# Patient Record
Sex: Male | Born: 2005 | Race: White | Hispanic: No | Marital: Single | Smoking: Never smoker
Health system: Southern US, Community
[De-identification: ages and names within clinical notes are randomized; demographics above are authoritative.]

---
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
## 2025-01-02 ENCOUNTER — Other Ambulatory Visit: Payer: Self-pay

## 2025-01-02 ENCOUNTER — Emergency Department: Payer: Self-pay

## 2025-01-02 DIAGNOSIS — R0602 Shortness of breath: Secondary | ICD-10-CM | POA: Insufficient documentation

## 2025-01-02 DIAGNOSIS — R258 Other abnormal involuntary movements: Secondary | ICD-10-CM | POA: Insufficient documentation

## 2025-01-02 DIAGNOSIS — R531 Weakness: Secondary | ICD-10-CM | POA: Insufficient documentation

## 2025-01-02 DIAGNOSIS — S0990XA Unspecified injury of head, initial encounter: Secondary | ICD-10-CM | POA: Insufficient documentation

## 2025-01-02 DIAGNOSIS — W228XXA Striking against or struck by other objects, initial encounter: Secondary | ICD-10-CM | POA: Insufficient documentation

## 2025-01-02 LAB — CBC WITH DIFFERENTIAL/PLATELET
Abs Immature Granulocytes: 0.01 10*3/uL (ref 0.00–0.07)
Basophils Absolute: 0.1 10*3/uL (ref 0.0–0.1)
Basophils Relative: 1 %
Eosinophils Absolute: 0.4 10*3/uL (ref 0.0–0.5)
Eosinophils Relative: 7 %
HCT: 43 % (ref 39.0–52.0)
Hemoglobin: 15.2 g/dL (ref 13.0–17.0)
Immature Granulocytes: 0 %
Lymphocytes Relative: 48 %
Lymphs Abs: 2.9 10*3/uL (ref 0.7–4.0)
MCH: 30.8 pg (ref 26.0–34.0)
MCHC: 35.3 g/dL (ref 30.0–36.0)
MCV: 87.2 fL (ref 80.0–100.0)
Monocytes Absolute: 0.4 10*3/uL (ref 0.1–1.0)
Monocytes Relative: 6 %
Neutro Abs: 2.2 10*3/uL (ref 1.7–7.7)
Neutrophils Relative %: 38 %
Platelets: 297 10*3/uL (ref 150–400)
RBC: 4.93 MIL/uL (ref 4.22–5.81)
RDW: 12.6 % (ref 11.5–15.5)
WBC: 5.9 10*3/uL (ref 4.0–10.5)
nRBC: 0 % (ref 0.0–0.2)

## 2025-01-02 LAB — COMPREHENSIVE METABOLIC PANEL WITH GFR
ALT: 26 U/L (ref 0–44)
AST: 22 U/L (ref 15–41)
Albumin: 4.8 g/dL (ref 3.5–5.0)
Alkaline Phosphatase: 116 U/L (ref 38–126)
Anion gap: 11 (ref 5–15)
BUN: 11 mg/dL (ref 6–20)
CO2: 26 mmol/L (ref 22–32)
Calcium: 9.5 mg/dL (ref 8.9–10.3)
Chloride: 106 mmol/L (ref 98–111)
Creatinine, Ser: 0.9 mg/dL (ref 0.61–1.24)
GFR, Estimated: >60 mL/min
Glucose, Bld: 127 mg/dL — ABNORMAL HIGH (ref 70–99)
Potassium: 3.6 mmol/L (ref 3.5–5.1)
Sodium: 142 mmol/L (ref 135–145)
Total Bilirubin: 1.1 mg/dL (ref 0.0–1.2)
Total Protein: 7.3 g/dL (ref 6.5–8.1)
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
## 2025-01-03 ENCOUNTER — Emergency Department
Admission: EM | Admit: 2025-01-03 | Discharge: 2025-01-03 | Disposition: A | Payer: Self-pay | Attending: Emergency Medicine | Admitting: Emergency Medicine

## 2025-01-03 DIAGNOSIS — S0990XA Unspecified injury of head, initial encounter: Secondary | ICD-10-CM

## 2025-01-03 DIAGNOSIS — J111 Influenza due to unidentified influenza virus with other respiratory manifestations: Secondary | ICD-10-CM

## 2025-01-03 LAB — RESP PANEL BY RT-PCR (RSV, FLU A&B, COVID)  RVPGX2
Influenza A by PCR: NEGATIVE
Influenza B by PCR: NEGATIVE
Resp Syncytial Virus by PCR: NEGATIVE
SARS Coronavirus 2 by RT PCR: NEGATIVE

## 2025-01-03 MED ORDER — IBUPROFEN 800 MG PO TABS
800.0000 mg | ORAL_TABLET | Freq: Once | ORAL | Status: AC
  Administered 2025-01-03: 800 mg via ORAL
  Filled 2025-01-03: qty 1

## 2025-01-03 NOTE — ED Notes (Signed)
 Declined respiratory panel.
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
## 2025-01-03 NOTE — Discharge Instructions (Signed)
 You may alternate over the counter Tylenol 1000 mg every 6 hours as needed for pain, fever and Ibuprofen 800 mg every 6-8 hours as needed for pain, fever.  Please take Ibuprofen with food.  Do not take more than 4000 mg of Tylenol (acetaminophen) in a 24 hour period.  You have had a head injury resulting in a concussion.  A concussion is a clinical diagnosis and not seen on imaging (CT, MRI).  Please avoid alcohol, sedatives for the next week.  Please rest and drink plenty of water.  We recommend that you avoid any activity that may lead to another head injury for at least 1 week or until your symptoms have completely resolved.  We also recommend brain rest to ensure the best possible long term outcomes - please avoid TV, cell phones, tablets, computers as much as possible for the next 48 hours.

## 2025-01-03 NOTE — ED Notes (Signed)
 Discharge instructions reviewed.   Opportunity for questions and concerns provided.   Alert, oriented and ambulatory.   Displays no signs of distress.   Encouraged to limit screen time and video games for concussive symptoms.
